# Patient Record
Sex: Male | Born: 1960 | Race: White | Hispanic: No | Marital: Married | Smoking: Former smoker
Health system: Southern US, Community
[De-identification: ages and names within clinical notes are randomized; demographics above are authoritative.]

## PROBLEM LIST (undated history)

## (undated) DIAGNOSIS — I10 Essential (primary) hypertension: Secondary | ICD-10-CM

## (undated) DIAGNOSIS — E119 Type 2 diabetes mellitus without complications: Secondary | ICD-10-CM

## (undated) DIAGNOSIS — K219 Gastro-esophageal reflux disease without esophagitis: Secondary | ICD-10-CM

## (undated) DIAGNOSIS — K760 Fatty (change of) liver, not elsewhere classified: Secondary | ICD-10-CM

## (undated) HISTORY — PX: REFRACTIVE SURGERY: SHX103

## (undated) HISTORY — PX: ROTATOR CUFF REPAIR: SHX139

## (undated) HISTORY — PX: COLONOSCOPY: SHX174

## (undated) HISTORY — PX: WISDOM TOOTH EXTRACTION: SHX21

## (undated) HISTORY — PX: VASECTOMY: SHX75

---

## 2009-12-12 ENCOUNTER — Ambulatory Visit: Payer: Self-pay | Admitting: Sports Medicine

## 2009-12-12 DIAGNOSIS — M216X9 Other acquired deformities of unspecified foot: Secondary | ICD-10-CM

## 2009-12-12 DIAGNOSIS — M25569 Pain in unspecified knee: Secondary | ICD-10-CM

## 2010-03-08 ENCOUNTER — Ambulatory Visit: Payer: Self-pay | Admitting: Sports Medicine

## 2010-11-20 ENCOUNTER — Ambulatory Visit: Admit: 2010-11-20 | Payer: Self-pay | Admitting: Sports Medicine

## 2010-11-26 NOTE — Assessment & Plan Note (Signed)
Summary: L KNEE PAIN,MC   Vital Signs:  Patient profile:   50 year old male Height:      70 inches Weight:      215 pounds BMI:     30.96 BP sitting:   142 / 95  Vitals Entered By: Lillia Pauls CMA (December 12, 2009 9:30 AM)  History of Present Illness: c/o infero-medial left knee pain of insidious onset in mid 09/2009. Preveiously ran twice daily. Pain preceeded by increase increase in daily lunch-time runs from 3 miles to 5 miles, along with basketball playing for 3 days. Previous avg weekly mileabge of 40 miles on the treadmill. Weekly avg down to 6-10 miles weekly (treadmill and elliptical) since onset of pain. Pain on prolonged ambulation. Minimal pain during runs. Decreased mileage as pre-emptive measure. Avg intensity 5/10. No radiation. No knee swelling/locking/buckling/catching. Occasionally painless popping. No prior left knee injuries or surgeries. No back/hip/ankle/foot pain.  Allergies (verified): No Known Drug Allergies  Past History:  Past Medical History: HTN - on combined lisinopril/hctz HLD - on lipitor  Obesity - has lost from 260 lbs all the way to 205 since started running program  Social History: Occasional ETOH No T/D  Solicitor of a [employer]  Physical Exam  General:  Well-developed,well-nourished,in no acute distress; alert,appropriate and cooperative throughout examination Msk:  HIPS: FROM. 5/5 strength throughout ROM testing.  KNEES: Slight valgus of left knee. No discoloration. No swelling or increased warmth. FROM. 5/5 strength.  Minimal dyscomfort to palpation over mid-medial joint line on the left. (-) left McMurray's/Thesally's. Normal ligament laxity. Equiovcal re: reproduced dyscomfort on valgus stress testing. (-) crepitus. Normal patella mobility.  ANKLES/FEET: FROM. 5/5 strength. Mild longitudinal arch collapse on standing. Slight relative pronation on the left; controlled on insertion of comforthotics with left  scaphoid pad. Splayed 1st/2nd toes. No callouses.  * Good running form. Extremities:  Equal leg lengths. Neurologic:  Normal nv exam.   Impression & Recommendations:  Problem # 1:  KNEE PAIN, LEFT (ICD-719.46) Likely 2/2 cartilage bruise  - Gradually increase exericse frequency as tolerated, integrating more elliptical and recumbent biking exercise into routine. - Gradually transition toward integration of speed workouts into weekly routine. - Comforthotics with left scaphoid pad. - RTC in 6 wks if pain does not resolve.  Problem # 2:  OTHER ACQUIRED DEFORMITY OF ANKLE AND FOOT OTHER (ICD-736.79)  - Comforthotics with medium scaphoid pad applied to the left insert. - RTC in 6 wks or sooner as needed any concerns.  Orders: [facility] [employer] [phone])

## 2010-11-26 NOTE — Assessment & Plan Note (Signed)
Summary: f/u,mc   Vital Signs:  Patient profile:   50 year old male BP sitting:   133 / 88  Vitals Entered By: Lillia Pauls CMA (Mar 08, 2010 11:30 AM)  History of Present Illness: Reports to f/u left knee pain. Following exercise regimen as previously instructed. Minimal improvement wrt to his left knee pain. Runs twice weekly, 3 miles each run. No pain during runs. Pain occurs the day after each run. Unknown moderating factors. Pain level reaches 7/10. Notes increased time (total 2-3 days) needed for recovery between runs which has not changed. No back pain or LE paresthesias. No knee swelling, locking, or catching. Still some painless popping. No prior left knee injuries or procedures.  Allergies: No Known Drug Allergies PMH-FH-SH reviewed for relevance  Physical Exam  General:  Well-developed,well-nourished,in no acute distress; alert,appropriate and cooperative throughout examination Msk:  LEFT KNEE: Unchanged from last examination except for equivocal thesally's. Still no ttp over pes bursa.  RUNNING FORM: Out-toeing of the feet (R>L) with dynamic pronation.  Additional Exam:  Musculoskeletal Ultrasound: Longitudinal and transverse views of the medial left knee revealed the following:  Menisci: Normal appearnce of the medial meniscus. Quadriceps Tendon: Normal. Patella: Normal. Patella Tendon: Normal. Tibial Tuberosity: Normal.     Impression & Recommendations:  Problem # 1:  KNEE PAIN, LEFT (ICD-719.46) Possibly underlying meniscal tear +/- chondramalacia. All management options, including their respective risks and benefits, were discussed with the patient during this encounter.   After obtaining informed verbal consent from the patient, a 27ml:2ml mixture of lidocaine and kenalog 40mg /ml was injected into the infero-medial aspect of his anterior left knee which was prepped with alcohol and betadine and anesthetized with ethyl chloride beforehand. No  procedural complications. Patient tolerated this procedure well.  Will order knee x-rays to r/o bony abnl and to evaluate joint spaces.  Immediately seek MD attention for fever, knee redness/swelling, increased knee pain or any other concerns.  Otherwise RTC in 1-2 wks. Will re-consider left knee MRI to r/o cartilage defect if no signfiicant improvement.   Orders: Radiology other (Radiology Other) [place] LIMITED [phone]) Joint Aspirate / Injection, Large (20610) Kenalog 10 mg inj (J3301)  Problem # 2:  OTHER ACQUIRED DEFORMITY OF ANKLE AND FOOT OTHER (ICD-736.79) Possibly contributing to chronic left knee pain.  - Continue use of comforthotics with scaphoid pads. - Start daily straight-line bounding drills, pidgeon-toe heel raises, and pigeon-toe walks, as demonstrated during this encounter. - Will consider custom orthotic construction given goals or working towards increasing weekly running mileage.

## 2017-01-14 ENCOUNTER — Ambulatory Visit (INDEPENDENT_AMBULATORY_CARE_PROVIDER_SITE_OTHER): Payer: 59 | Admitting: Orthopedic Surgery

## 2017-01-14 DIAGNOSIS — M25511 Pain in right shoulder: Secondary | ICD-10-CM | POA: Diagnosis not present

## 2017-01-14 NOTE — Progress Notes (Signed)
Office Visit Note   Patient: Clinton Weaver           Date of Birth: 1961-07-27           MRN: [id number] Visit Date: 01/14/2017 Requested by: No referring provider defined for this encounter. PCP: No PCP Per Patient  Subjective: No chief complaint on file.   HPI: Noam is a 56 year old patient with right shoulder pain.  He had what sounds like subacromial decompression and partial thickness rotator cuff tear debridement 20 years ago.  Arthroscopic incisional scars are present.  Arthroscopic rotator cuff repair as not being done at that time.  He did well until last summer when he developed pain in the shoulder throwing baseball on the beach.  He did not have a discrete injury but he states that the next day he couldn't lift his arm and he isn't having symptoms since that time.  3 months later he had a cortisone injection which helped but then his symptoms recurred.  He describes pain raising his arm up as well as pain which wakes him from sleep on nightly basis.  He states she really can't lift anything.  He does not take much in way of medication for the problem on a consistent basis.  He denies any neck pain or radicular symptoms.  Localizes the pain to the anterior and lateral aspect of the right shoulder.              ROS: All systems reviewed are negative as they relate to the chief complaint within the history of present illness.  Patient denies  fevers or chills.   Assessment & Plan: Visit Diagnoses:  1. Acute pain of right shoulder     Plan: Impression is right shoulder pain which may have a component of bursitis as well as a small recurrent rotator cuff tear versus intra-articular labral and biceps tendon pathology.  He is not tender to the acromioclavicular joint to direct palpation.  Plan at this time his MRI scan right shoulder.  It was very difficult recovery for for him 20 years ago and thus he wants to avoid surgery at all costs.  I'll see him back after his scan. Subacromial  injection is performed today for pain relief. Follow-Up Instructions: Return for after MRI.   Orders:  Orders Placed This Encounter  Procedures  . Arthrogram   No orders of the defined types were placed in this encounter.     Procedures: Large Joint Inj Date/Time: 01/15/2017 4:07 AM Performed by: Cammy Copa Authorized by: Cammy Copa   Consent Given by:  Patient Site marked: the procedure site was marked   Timeout: prior to procedure the correct patient, procedure, and site was verified   Indications:  Pain and diagnostic evaluation Location:  Shoulder Site:  R subacromial bursa Prep: patient was prepped and draped in usual sterile fashion   Needle Size:  18 G Needle Length:  1.5 inches Approach:  Posterior Ultrasound Guidance: No   Fluoroscopic Guidance: No   Arthrogram: No   Medications:  5 mL lidocaine 1 %; 9 mL bupivacaine 0.5 %; 40 mg methylPREDNISolone acetate 40 MG/ML Aspiration Attempted: No   Patient tolerance:  Patient tolerated the procedure well with no immediate complications     Clinical Data: No additional findings.  Objective: Vital Signs: There were no vitals taken for this visit.  Physical Exam:   Constitutional: Patient appears well-developed HEENT:  Head: Normocephalic Eyes:EOM are normal Neck: Normal range of motion Cardiovascular:  Normal rate Pulmonary/chest: Effort normal Neurologic: Patient is alert Skin: Skin is warm Psychiatric: Patient has normal mood and affect    Ortho Exam: Orthopedic exam demonstrates good cervical spine range of motion.  Intact motor sensory function to the right arm.  He does have pain with overhead motion on the right compared to the left but no restriction of passive external rotation right versus left.  Rotator cuff strength is painful to infraspinatus testing on the right but not the left and he does have a little weakness there.  O'Brien's testing positive on the right negative on the  left.  There is no discrete tenderness to the right or left acromioclavicular joint.  Negative apprehension relocation testing on the right-hand side.  No other masses lymph adenopathy or skin changes noted in the right shoulder region well-healed surgical incisions are present  Specialty Comments:  No specialty comments available.  Imaging: No results found.   PMFS History: Patient Active Problem List   Diagnosis Date Noted  . KNEE PAIN, LEFT 12/12/2009  . OTHER ACQUIRED DEFORMITY OF ANKLE AND FOOT OTHER 12/12/2009   No past medical history on file.  No family history on file.  No past surgical history on file. Social History   Occupational History  . Not on file.   Social History Main Topics  . Smoking status: Not on file  . Smokeless tobacco: Not on file  . Alcohol use Not on file  . Drug use: Unknown  . Sexual activity: Not on file

## 2017-01-15 DIAGNOSIS — M25511 Pain in right shoulder: Secondary | ICD-10-CM | POA: Diagnosis not present

## 2017-01-15 MED ORDER — BUPIVACAINE HCL 0.5 % IJ SOLN
9.0000 mL | INTRAMUSCULAR | Status: AC | PRN
  Administered 2017-01-15: 9 mL via INTRA_ARTICULAR

## 2017-01-15 MED ORDER — LIDOCAINE HCL 1 % IJ SOLN
5.0000 mL | INTRAMUSCULAR | Status: AC | PRN
Start: 2017-01-15 — End: 2017-01-15
  Administered 2017-01-15: 5 mL

## 2017-01-15 MED ORDER — METHYLPREDNISOLONE ACETATE 40 MG/ML IJ SUSP
40.0000 mg | INTRAMUSCULAR | Status: AC | PRN
  Administered 2017-01-15: 40 mg via INTRA_ARTICULAR

## 2017-02-04 ENCOUNTER — Ambulatory Visit
Admission: RE | Admit: 2017-02-04 | Discharge: 2017-02-04 | Disposition: A | Discharge status: Home / Self Care | Payer: 59 | Source: Ambulatory Visit | Attending: Orthopedic Surgery | Admitting: Orthopedic Surgery

## 2017-02-04 DIAGNOSIS — M25511 Pain in right shoulder: Secondary | ICD-10-CM

## 2017-02-04 MED ORDER — IOPAMIDOL (ISOVUE-M 200) INJECTION 41%: 15.0000 mL | Freq: Once | INTRAMUSCULAR | Status: DC

## 2017-02-11 ENCOUNTER — Encounter (INDEPENDENT_AMBULATORY_CARE_PROVIDER_SITE_OTHER): Payer: Self-pay | Admitting: Orthopedic Surgery

## 2017-02-11 ENCOUNTER — Ambulatory Visit (INDEPENDENT_AMBULATORY_CARE_PROVIDER_SITE_OTHER): Payer: 59 | Admitting: Orthopedic Surgery

## 2017-02-11 DIAGNOSIS — M25511 Pain in right shoulder: Secondary | ICD-10-CM | POA: Diagnosis not present

## 2017-02-12 ENCOUNTER — Encounter (INDEPENDENT_AMBULATORY_CARE_PROVIDER_SITE_OTHER): Payer: Self-pay | Admitting: Orthopedic Surgery

## 2017-02-12 NOTE — Progress Notes (Signed)
Office Visit Note   Patient: Clinton Weaver           Date of Birth: 1961/03/08           MRN: [id number] Visit Date: 02/11/2017 Requested by: No referring provider defined for this encounter. PCP: No PCP Per Patient  Subjective: Chief Complaint  Patient presents with  . Right Shoulder - Follow-up    HPI: Clinton Weaver is a 56 year old patient here to review MRI scan.  He had an MRI scan of his right shoulder performed a week ago.  Has the same symptoms in the shoulder with weakness and pain.  That is essentially unchanged.  I reviewed the MRI scan with him and he has about a 90% thickness tear of the supraspinatus.  There is no dye extravasation into the subacromial space but the footprint is almost completely exposed superiorly.  He also has some tendinopathy of the biceps tendon which is reviewed.              ROS: All systems reviewed are negative as they relate to the chief complaint within the history of present illness.  Patient denies  fevers or chills.   Assessment & Plan: Visit Diagnoses:  1. Right shoulder pain, unspecified chronicity     Plan: Impression is right shoulder rotator cuff tear with functional limitations of strength and fairly significant pain with any type of activity of daily living.  He can't really do anything athletically.  His symptoms are worsening.  He does have weakness on exam as well.  He is a little concerned about surgery with all his medical problems and thus he will see his medical doctor prior to considering any surgery.  I do think that this particular tear with the progression of his symptoms over time does not have the potential to heal on its own.  On exam he doesn't have the course grinding and crepitus which I would expect with a full-thickness tear but I think he is essentially "hanging on by thread".  If he wants to schedule surgery we can get that done and I'll see him back as needed I did discuss with him the rehabilitation and expected healing  course to expect. Follow-Up Instructions: Return if symptoms worsen or fail to improve.   Orders:  No orders of the defined types were placed in this encounter.  No orders of the defined types were placed in this encounter.     Procedures: No procedures performed   Clinical Data: No additional findings.  Objective: Vital Signs: There were no vitals taken for this visit.  Physical Exam:   Constitutional: Patient appears well-developed HEENT:  Head: Normocephalic Eyes:EOM are normal Neck: Normal range of motion Cardiovascular: Normal rate Pulmonary/chest: Effort normal Neurologic: Patient is alert Skin: Skin is warm Psychiatric: Patient has normal mood and affect    Ortho Exam: Orthopedic exam demonstrates good cervical spine range of motion some weakness to supraspinatus testing and infraspinatus testing the subscap strength is intact.  O'Brien's testing and speed's testing positive on the right negative on the left.  Impingement signs equivocal on the right negative on the left.  No restriction of passive motion on the right compared to the left.  No course grinding or crepitus with internal/external rotation of that right hand side  Specialty Comments:  No specialty comments available.  Imaging: No results found.   PMFS History: Patient Active Problem List   Diagnosis Date Noted  . KNEE PAIN, LEFT 12/12/2009  . OTHER ACQUIRED DEFORMITY  OF ANKLE AND FOOT OTHER 12/12/2009   No past medical history on file.  No family history on file.  No past surgical history on file. Social History   Occupational History  . Not on file.   Social History Main Topics  . Smoking status: Not on file  . Smokeless tobacco: Not on file  . Alcohol use Not on file  . Drug use: Unknown  . Sexual activity: Not on file

## 2017-06-24 ENCOUNTER — Encounter (INDEPENDENT_AMBULATORY_CARE_PROVIDER_SITE_OTHER): Payer: Self-pay | Admitting: Orthopedic Surgery

## 2017-06-24 ENCOUNTER — Ambulatory Visit (INDEPENDENT_AMBULATORY_CARE_PROVIDER_SITE_OTHER): Payer: 59 | Admitting: Orthopedic Surgery

## 2017-06-24 DIAGNOSIS — M75101 Unspecified rotator cuff tear or rupture of right shoulder, not specified as traumatic: Secondary | ICD-10-CM | POA: Diagnosis not present

## 2017-06-25 NOTE — Progress Notes (Signed)
Office Visit Note   Patient: Clinton Weaver           Date of Birth: 11/02/1960           MRN: [id number] Visit Date: 06/24/2017 Requested by: No referring provider defined for this encounter. PCP: Jolene ProvostHaimes, David M, MD  Subjective: Chief Complaint  Patient presents with  . Right Shoulder - Pain    HPI: Clinton Weaver is a [age] year old patient with right shoulder pain.  Since I have seen him he has had an MRI scan which is reviewed.  He has essentially a full-thickness tear of the supraspinatus.  Subscapularis and infraspinatus intact.  No fatty atrophy of the muscles.  There is some tendinosis of the biceps tendon.  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  He is functionally limited with his shoulder in terms of throwing and doing physical activities.  He has had conservative care and that has not been sufficient in terms of restoring his pain-free function              ROS: All systems reviewed are negative as they relate to the chief complaint within the history of present illness.  Patient denies  fevers or chills.   Assessment & Plan: Visit Diagnoses: No diagnosis found.  Plan: Impression is right shoulder rotator cuff tear with biceps tendon pathology.  Not much in the way of tenderness to the acromioclavicular joint.  Plan is arthroscopy, evaluation of the biceps tendon, any open rotator cuff tear repair.  Risks and benefits are discussed with the patient could not limited to infection or vessel damage shoulder stiffness as well as an ability to return to previous level of physical activity.  I do plan to use CPM machine postoperatively.  All questions answered.  Rehabilitation course discussed.  Follow-Up Instructions: No Follow-up on file.   Orders:  No orders of the defined types were placed in this encounter.  No orders of the defined types were placed in this encounter.     Procedures: No procedures performed   Clinical  Data: No additional findings.  Objective: Vital Signs: There were no vitals taken for this visit.  Physical Exam:   Constitutional: Patient appears well-developed HEENT:  Head: Normocephalic Eyes:EOM are normal Neck: Normal range of motion Cardiovascular: Normal rate Pulmonary/chest: Effort normal Neurologic: Patient is alert Skin: Skin is warm Psychiatric: Patient has normal mood and affect    Ortho Exam: Orthopedic exam demonstrates good cervical spine range of motion.  His right shoulder has good range of motion today with no restriction of external rotation at 15 abduction.  Motor sensory function to the hand is intact.  He does have positive O'Brien's testing negative speed's testing negative apprehension testing no acromioclavicular joint tenderness to direct palpation right and left.  A little weakness to resisted abduction on the right compared to the left.  Specialty Comments:  No specialty comments available.  Imaging: No results found.   PMFS History: Patient Active Problem List   Diagnosis Date Noted  . KNEE PAIN, LEFT 12/12/2009  . OTHER ACQUIRED DEFORMITY OF ANKLE AND FOOT OTHER 12/12/2009   No past medical history on file.  No family history on file.  No past surgical history on file. Social History   Occupational History  . Not on file.   Social History Main Topics  . Smoking status: Unknown If Ever Smoked  . Smokeless tobacco: Never Used  . Alcohol use  Not on file  . Drug use: Unknown  . Sexual activity: Not on file

## 2017-09-09 ENCOUNTER — Other Ambulatory Visit (INDEPENDENT_AMBULATORY_CARE_PROVIDER_SITE_OTHER): Payer: Self-pay | Admitting: Orthopedic Surgery

## 2017-09-09 DIAGNOSIS — M75101 Unspecified rotator cuff tear or rupture of right shoulder, not specified as traumatic: Secondary | ICD-10-CM

## 2017-09-23 ENCOUNTER — Other Ambulatory Visit: Payer: Self-pay

## 2017-09-23 ENCOUNTER — Encounter (HOSPITAL_COMMUNITY): Payer: Self-pay

## 2017-09-23 ENCOUNTER — Other Ambulatory Visit (HOSPITAL_COMMUNITY): Payer: Self-pay | Admitting: *Deleted

## 2017-09-23 ENCOUNTER — Encounter (HOSPITAL_COMMUNITY)
Admission: RE | Admit: 2017-09-23 | Discharge: 2017-09-23 | Disposition: A | Discharge status: Home / Self Care | Payer: 59 | Source: Ambulatory Visit | Attending: Orthopedic Surgery | Admitting: Orthopedic Surgery

## 2017-09-23 DIAGNOSIS — F419 Anxiety disorder, unspecified: Secondary | ICD-10-CM | POA: Diagnosis not present

## 2017-09-23 DIAGNOSIS — I10 Essential (primary) hypertension: Secondary | ICD-10-CM | POA: Diagnosis not present

## 2017-09-23 DIAGNOSIS — Z79899 Other long term (current) drug therapy: Secondary | ICD-10-CM | POA: Diagnosis not present

## 2017-09-23 DIAGNOSIS — K219 Gastro-esophageal reflux disease without esophagitis: Secondary | ICD-10-CM | POA: Diagnosis not present

## 2017-09-23 DIAGNOSIS — E119 Type 2 diabetes mellitus without complications: Secondary | ICD-10-CM | POA: Diagnosis not present

## 2017-09-23 DIAGNOSIS — K760 Fatty (change of) liver, not elsewhere classified: Secondary | ICD-10-CM | POA: Diagnosis not present

## 2017-09-23 DIAGNOSIS — M75111 Incomplete rotator cuff tear or rupture of right shoulder, not specified as traumatic: Secondary | ICD-10-CM | POA: Diagnosis not present

## 2017-09-23 DIAGNOSIS — M7521 Bicipital tendinitis, right shoulder: Secondary | ICD-10-CM | POA: Diagnosis not present

## 2017-09-23 DIAGNOSIS — Z6832 Body mass index (BMI) 32.0-32.9, adult: Secondary | ICD-10-CM | POA: Diagnosis not present

## 2017-09-23 DIAGNOSIS — Z7982 Long term (current) use of aspirin: Secondary | ICD-10-CM | POA: Diagnosis not present

## 2017-09-23 DIAGNOSIS — Z87891 Personal history of nicotine dependence: Secondary | ICD-10-CM | POA: Diagnosis not present

## 2017-09-23 DIAGNOSIS — Z7984 Long term (current) use of oral hypoglycemic drugs: Secondary | ICD-10-CM | POA: Diagnosis not present

## 2017-09-23 DIAGNOSIS — Z8249 Family history of ischemic heart disease and other diseases of the circulatory system: Secondary | ICD-10-CM | POA: Diagnosis not present

## 2017-09-23 HISTORY — DX: Type 2 diabetes mellitus without complications: E11.9

## 2017-09-23 HISTORY — DX: Gastro-esophageal reflux disease without esophagitis: K21.9

## 2017-09-23 HISTORY — DX: Fatty (change of) liver, not elsewhere classified: K76.0

## 2017-09-23 HISTORY — DX: Essential (primary) hypertension: I10

## 2017-09-23 LAB — COMPREHENSIVE METABOLIC PANEL
ALT: 132 U/L — AB (ref 17–63)
AST: 121 U/L — ABNORMAL HIGH (ref 15–41)
Albumin: 4.7 g/dL (ref 3.5–5.0)
Alkaline Phosphatase: 220 U/L — ABNORMAL HIGH (ref 38–126)
Anion gap: 13 (ref 5–15)
BILIRUBIN TOTAL: 1.1 mg/dL (ref 0.3–1.2)
BUN: 16 mg/dL (ref 6–20)
CHLORIDE: 98 mmol/L — AB (ref 101–111)
CO2: 22 mmol/L (ref 22–32)
CREATININE: 0.93 mg/dL (ref 0.61–1.24)
Calcium: 10.1 mg/dL (ref 8.9–10.3)
Glucose, Bld: 130 mg/dL — ABNORMAL HIGH (ref 65–99)
Potassium: 4.1 mmol/L (ref 3.5–5.1)
Sodium: 133 mmol/L — ABNORMAL LOW (ref 135–145)
TOTAL PROTEIN: 8.2 g/dL — AB (ref 6.5–8.1)

## 2017-09-23 LAB — GLUCOSE, CAPILLARY: GLUCOSE-CAPILLARY: 134 mg/dL — AB (ref 65–99)

## 2017-09-23 LAB — CBC
HCT: 49.6 % (ref 39.0–52.0)
Hemoglobin: 16.6 g/dL (ref 13.0–17.0)
MCH: 31.3 pg (ref 26.0–34.0)
MCHC: 33.5 g/dL (ref 30.0–36.0)
MCV: 93.4 fL (ref 78.0–100.0)
Platelets: 222 10*3/uL (ref 150–400)
RBC: 5.31 MIL/uL (ref 4.22–5.81)
RDW: 13 % (ref 11.5–15.5)
WBC: 8.9 10*3/uL (ref 4.0–10.5)

## 2017-09-23 LAB — HEMOGLOBIN A1C
HEMOGLOBIN A1C: 7.9 % — AB (ref 4.8–5.6)
MEAN PLASMA GLUCOSE: 180.03 mg/dL

## 2017-09-23 NOTE — Pre-Procedure Instructions (Signed)
Clinton CommanderJohn Weaver  09/23/2017    Your procedure is scheduled on Friday, September 25, 2017 at 7:30 AM.   [facility] at 5:30 AM.   Call this number if you have problems the morning of surgery: [phone]   Questions prior to day of surgery, please call [phone] between 8 & 4 PM.   Remember:  Do not eat food or drink liquids after midnight Thursday, 09/24/17.  Take these medicines the morning of surgery with A SIP OF WATER: Diltiazem (Cardizem), Venlafaxine (Effexor), Ranitidine (Zantac) - if needed.  Do not take your Sitagliptin-Metformin (Janumet) or Canaglifozin (Invokana) morning of surgery.  Do not use Aspirin products or NSAIDS (Ibuprofen, Aleve, etc) prior to surgery.   How to Manage Your Diabetes Before Surgery   Why is it important to control my blood sugar before and after surgery?   Improving blood sugar levels before and after surgery helps healing and can limit problems.  A way of improving blood sugar control is eating a healthy diet by:  - Eating less sugar and carbohydrates  - Increasing activity/exercise  - Talk with your doctor about reaching your blood sugar goals  High blood sugars (greater than 180 mg/dL) can raise your risk of infections and slow down your recovery so you will need to focus on controlling your diabetes during the weeks before surgery.  Make sure that the doctor who takes care of your diabetes knows about your planned surgery including the date and location.  How do I manage my blood sugars before surgery?   Check your blood sugar at least 4 times a day, 2 days before surgery to make sure that they are not too high or low.  Check your blood sugar the morning of your surgery when you wake up and every 2 hours until you get to the Short-Stay unit.  Treat a low blood sugar (less than 70 mg/dL) with 1/2 cup of clear juice (cranberry or apple), 4 glucose tablets, OR glucose gel.  Recheck  blood sugar in 15 minutes after treatment (to make sure it is greater than 70 mg/dL).  If blood sugar is not greater than 70 mg/dL on re-check, call [phone] for further instructions.   Report your blood sugar to the Short-Stay nurse when you get to Short-Stay.  References:  [facility], 2007 "How to Manage your Diabetes Before and After Surgery".   Do not wear jewelry.  Do not wear lotions, powders, cologne or deodorant.  Men may shave face and neck.  Do not bring valuables to the hospital.  [facility] is not responsible for any belongings or valuables.  Contacts, dentures or bridgework may not be worn into surgery.  Leave your suitcase in the car.  After surgery it may be brought to your room.  For patients admitted to the hospital, discharge time will be determined by your treatment team.  Patients discharged the day of surgery will not be allowed to drive home.   [facility] - Preparing for Surgery  Before surgery, you can play an important role.  Because skin is not sterile, your skin needs to be as free of germs as possible.  You can reduce the number of germs on you skin by washing with CHG (chlorahexidine gluconate) soap before surgery.  CHG is an antiseptic cleaner which kills germs and bonds with the skin to continue killing germs even after washing.  Please DO NOT use if you have an  allergy to CHG or antibacterial soaps.  If your skin becomes reddened/irritated stop using the CHG and inform your nurse when you arrive at Short Stay.  Do not shave (including legs and underarms) for at least 48 hours prior to the first CHG shower.  You may shave your face.  Please follow these instructions carefully:   1.  Shower with CHG Soap the night before surgery and the                    morning of Surgery.  2.  If you choose to wash your hair, wash your hair first as usual with your       normal shampoo.  3.  After you shampoo, rinse your hair and body  thoroughly to remove the shampoo.  4.  Use CHG as you would any other liquid soap.  You can apply chg directly       to the skin and wash gently with scrungie or a clean washcloth.  5.  Apply the CHG Soap to your body ONLY FROM THE NECK DOWN.        Do not use on open wounds or open sores.  Avoid contact with your eyes, ears, mouth and genitals (private parts).  Wash genitals (private parts) with your normal soap.  6.  Wash thoroughly, paying special attention to the area where your surgery        will be performed.  7.  Thoroughly rinse your body with warm water from the neck down.  8.  DO NOT shower/wash with your normal soap after using and rinsing off       the CHG Soap.  9.  Pat yourself dry with a clean towel.            10.  Wear clean pajamas.            11.  Place clean sheets on your bed the night of your first shower and do not        sleep with pets.  Day of Surgery  Shower as above. Do not apply any lotions/deodorants the morning of surgery.  Please wear clean clothes to the hospital.   Please read over the fact sheets that you were given.

## 2017-09-23 NOTE — Progress Notes (Signed)
Pt denies cardiac history, chest pain or sob. Pt is a type 2 diabetic. Thinks his last A1C was 8.something 3 months ago. Pt wears a blood sugar sensor 24/7. His fasting blood sugar averages between 150-160.

## 2017-09-24 NOTE — Progress Notes (Signed)
Anesthesia Chart Review:  Pt is a 56 year old male scheduled for R shoulder arthroplasty with mini-open rotator cuff repair and subacromial decompression on 09/25/2017 with Meredith Pel, M.D.  - PCP is Charlcie Cradle, MD (notes in care everywhere)   PMH includes:  HTN, DM, fatty liver. Former smoker. BMI 33  Medications include: ASA 81 mg, Lipitor, invokana, diltiazem, Zetia, losartan-HCTZ, Zantac, sitagliptin-metformin  VS BP (!) 144/86   Pulse 87   Temp 36.9 C   Resp 20   Ht 5' 10"  (1.778 m)   Wt 227 lb 12.8 oz (103.3 kg)   SpO2 96%   BMI 32.69 kg/m   Preoperative labs reviewed.   - HbA1c 7.9, glucose 130  - AST 121, ALT 132, alk phos 220. These results are higher but still consistent with labs from PCP's office 09/07/17 (see care everywhere: AST 90, ALT 116, alk phos 207); pt has known fatty liver.   - Will get PT/PTT day of surgery  EKG 09/23/17: NSR. Nonspecific T wave abnormality  Abdominal US 03/31/17 (care everywhere):  - Increased hepatic echogenicity consistent with fatty infiltration and/or hepatocellular disease.  - No focal hepatic abnormality identified.  - No gallstones or biliary distention.  If labs acceptable day of surgery, I anticipate pt can proceed with surgery as scheduled.   Willeen Cass, FNP-BC [facility]/Anesthesiology Phone: [phone] 09/24/2017 10:24 AM

## 2017-09-24 NOTE — Anesthesia Preprocedure Evaluation (Addendum)
Anesthesia Evaluation  Patient identified by MRN, date of birth, ID band Patient awake    Reviewed: Allergy & Precautions, NPO status , Patient's Chart, lab work & pertinent test results  History of Anesthesia Complications Negative for: history of anesthetic complications  Airway Mallampati: II  TM Distance: >3 FB Neck ROM: Full    Dental  (+) Dental Advisory Given, Teeth Intact   Pulmonary neg pulmonary ROS, former smoker,    breath sounds clear to auscultation       Cardiovascular hypertension, Pt. on medications (-) angina Rhythm:Regular Rate:Normal     Neuro/Psych Anxiety negative neurological ROS     GI/Hepatic Neg liver ROS, GERD  Medicated and Controlled,Elevated LFTs: 6/18 US Increase echogenicity consistent fatty infiltration and/or hepatocellular disease   Endo/Other  diabetes (glu 167), Oral Hypoglycemic AgentsMorbid obesity  Renal/GU negative Renal ROS     Musculoskeletal   Abdominal (+) + obese,   Peds  Hematology   Anesthesia Other Findings   Reproductive/Obstetrics                           Anesthesia Physical Anesthesia Plan  ASA: II  Anesthesia Plan: General   Post-op Pain Management: GA combined w/ Regional for post-op pain   Induction: Intravenous  PONV Risk Score and Plan: 2 and Ondansetron, Dexamethasone and Midazolam  Airway Management Planned: Oral ETT  Additional Equipment:   Intra-op Plan:   Post-operative Plan: Extubation in OR  Informed Consent: I have reviewed the patients History and Physical, chart, labs and discussed the procedure including the risks, benefits and alternatives for the proposed anesthesia with the patient or authorized representative who has indicated his/her understanding and acceptance.   Dental advisory given  Plan Discussed with: CRNA, Surgeon and Anesthesiologist  Anesthesia Plan Comments: (Plan routine monitors, GETA  with interscalene block for post op analgesia)       Anesthesia Quick Evaluation

## 2017-09-25 ENCOUNTER — Ambulatory Visit (HOSPITAL_COMMUNITY): Payer: 59 | Admitting: Emergency Medicine

## 2017-09-25 ENCOUNTER — Ambulatory Visit (HOSPITAL_COMMUNITY): Payer: 59 | Admitting: Certified Registered Nurse Anesthetist

## 2017-09-25 ENCOUNTER — Ambulatory Visit (HOSPITAL_COMMUNITY)
Admission: RE | Admit: 2017-09-25 | Discharge: 2017-09-25 | Disposition: A | Discharge status: Home / Self Care | Payer: 59 | Source: Ambulatory Visit | Attending: Orthopedic Surgery | Admitting: Orthopedic Surgery

## 2017-09-25 ENCOUNTER — Encounter (HOSPITAL_COMMUNITY): Payer: Self-pay | Admitting: Certified Registered Nurse Anesthetist

## 2017-09-25 ENCOUNTER — Encounter (HOSPITAL_COMMUNITY)
Admission: RE | Disposition: A | Discharge status: Home / Self Care | Payer: Self-pay | Source: Ambulatory Visit | Attending: Orthopedic Surgery

## 2017-09-25 DIAGNOSIS — E119 Type 2 diabetes mellitus without complications: Secondary | ICD-10-CM | POA: Insufficient documentation

## 2017-09-25 DIAGNOSIS — I10 Essential (primary) hypertension: Secondary | ICD-10-CM | POA: Insufficient documentation

## 2017-09-25 DIAGNOSIS — Z7984 Long term (current) use of oral hypoglycemic drugs: Secondary | ICD-10-CM | POA: Insufficient documentation

## 2017-09-25 DIAGNOSIS — Z87891 Personal history of nicotine dependence: Secondary | ICD-10-CM | POA: Insufficient documentation

## 2017-09-25 DIAGNOSIS — K760 Fatty (change of) liver, not elsewhere classified: Secondary | ICD-10-CM | POA: Insufficient documentation

## 2017-09-25 DIAGNOSIS — M75111 Incomplete rotator cuff tear or rupture of right shoulder, not specified as traumatic: Secondary | ICD-10-CM | POA: Diagnosis not present

## 2017-09-25 DIAGNOSIS — M75101 Unspecified rotator cuff tear or rupture of right shoulder, not specified as traumatic: Secondary | ICD-10-CM

## 2017-09-25 DIAGNOSIS — Z8249 Family history of ischemic heart disease and other diseases of the circulatory system: Secondary | ICD-10-CM | POA: Insufficient documentation

## 2017-09-25 DIAGNOSIS — Z6832 Body mass index (BMI) 32.0-32.9, adult: Secondary | ICD-10-CM | POA: Insufficient documentation

## 2017-09-25 DIAGNOSIS — M7521 Bicipital tendinitis, right shoulder: Secondary | ICD-10-CM | POA: Insufficient documentation

## 2017-09-25 DIAGNOSIS — Z7982 Long term (current) use of aspirin: Secondary | ICD-10-CM | POA: Insufficient documentation

## 2017-09-25 DIAGNOSIS — K219 Gastro-esophageal reflux disease without esophagitis: Secondary | ICD-10-CM | POA: Insufficient documentation

## 2017-09-25 DIAGNOSIS — Z79899 Other long term (current) drug therapy: Secondary | ICD-10-CM | POA: Insufficient documentation

## 2017-09-25 DIAGNOSIS — F419 Anxiety disorder, unspecified: Secondary | ICD-10-CM | POA: Insufficient documentation

## 2017-09-25 DIAGNOSIS — M75121 Complete rotator cuff tear or rupture of right shoulder, not specified as traumatic: Secondary | ICD-10-CM | POA: Diagnosis not present

## 2017-09-25 HISTORY — PX: SHOULDER ARTHROSCOPY WITH ROTATOR CUFF REPAIR AND SUBACROMIAL DECOMPRESSION: SHX5686

## 2017-09-25 LAB — GLUCOSE, CAPILLARY
GLUCOSE-CAPILLARY: 167 mg/dL — AB (ref 65–99)
Glucose-Capillary: 191 mg/dL — ABNORMAL HIGH (ref 65–99)

## 2017-09-25 LAB — PROTIME-INR
INR: 0.99
Prothrombin Time: 13 seconds (ref 11.4–15.2)

## 2017-09-25 LAB — APTT: aPTT: 28 seconds (ref 24–36)

## 2017-09-25 SURGERY — SHOULDER ARTHROSCOPY WITH ROTATOR CUFF REPAIR AND SUBACROMIAL DECOMPRESSION
Anesthesia: Regional | Site: Shoulder | Laterality: Right

## 2017-09-25 MED ORDER — PHENYLEPHRINE HCL 10 MG/ML IJ SOLN
INTRAMUSCULAR | Status: DC | PRN
  Administered 2017-09-25: 40 ug/min via INTRAVENOUS

## 2017-09-25 MED ORDER — MIDAZOLAM HCL 2 MG/2ML IJ SOLN
INTRAMUSCULAR | Status: AC
  Filled 2017-09-25: qty 2

## 2017-09-25 MED ORDER — MEPERIDINE HCL 25 MG/ML IJ SOLN: 6.2500 mg | INTRAMUSCULAR | Status: DC | PRN

## 2017-09-25 MED ORDER — EPINEPHRINE PF 1 MG/ML IJ SOLN
INTRAMUSCULAR | Status: AC
  Filled 2017-09-25: qty 1

## 2017-09-25 MED ORDER — PROPOFOL 10 MG/ML IV BOLUS
INTRAVENOUS | Status: DC | PRN
  Administered 2017-09-25: 200 mg via INTRAVENOUS

## 2017-09-25 MED ORDER — HYDROMORPHONE HCL 1 MG/ML IJ SOLN
INTRAMUSCULAR | Status: AC
  Filled 2017-09-25: qty 1

## 2017-09-25 MED ORDER — HYDROMORPHONE HCL 1 MG/ML IJ SOLN
0.2500 mg | INTRAMUSCULAR | Status: DC | PRN
  Administered 2017-09-25: 0.5 mg via INTRAVENOUS

## 2017-09-25 MED ORDER — SUCCINYLCHOLINE CHLORIDE 200 MG/10ML IV SOSY
PREFILLED_SYRINGE | INTRAVENOUS | Status: AC
Start: 2017-09-25 — End: ?
  Filled 2017-09-25: qty 10

## 2017-09-25 MED ORDER — EPINEPHRINE PF 1 MG/ML IJ SOLN
INTRAMUSCULAR | Status: AC
  Filled 2017-09-25: qty 2

## 2017-09-25 MED ORDER — PROMETHAZINE HCL 25 MG/ML IJ SOLN: 6.2500 mg | INTRAMUSCULAR | Status: DC | PRN

## 2017-09-25 MED ORDER — BUPIVACAINE-EPINEPHRINE (PF) 0.5% -1:200000 IJ SOLN
INTRAMUSCULAR | Status: DC | PRN
  Administered 2017-09-25: 30 mL via PERINEURAL

## 2017-09-25 MED ORDER — FENTANYL CITRATE (PF) 250 MCG/5ML IJ SOLN
INTRAMUSCULAR | Status: AC
  Filled 2017-09-25: qty 5

## 2017-09-25 MED ORDER — BUPIVACAINE HCL (PF) 0.25 % IJ SOLN
INTRAMUSCULAR | Status: DC | PRN
  Administered 2017-09-25: 30 mL

## 2017-09-25 MED ORDER — CEFAZOLIN SODIUM-DEXTROSE 2-4 GM/100ML-% IV SOLN
INTRAVENOUS | Status: AC
  Filled 2017-09-25: qty 100

## 2017-09-25 MED ORDER — SUGAMMADEX SODIUM 200 MG/2ML IV SOLN
INTRAVENOUS | Status: DC | PRN
  Administered 2017-09-25: 200 mg via INTRAVENOUS

## 2017-09-25 MED ORDER — ONDANSETRON HCL 4 MG/2ML IJ SOLN
INTRAMUSCULAR | Status: DC | PRN
  Administered 2017-09-25: 4 mg via INTRAVENOUS

## 2017-09-25 MED ORDER — MIDAZOLAM HCL 2 MG/2ML IJ SOLN: 0.5000 mg | Freq: Once | INTRAMUSCULAR | Status: DC | PRN

## 2017-09-25 MED ORDER — LIDOCAINE HCL (CARDIAC) 20 MG/ML IV SOLN
INTRAVENOUS | Status: DC | PRN
  Administered 2017-09-25: 30 mg via INTRAVENOUS

## 2017-09-25 MED ORDER — MIDAZOLAM HCL 5 MG/5ML IJ SOLN
INTRAMUSCULAR | Status: DC | PRN
  Administered 2017-09-25: 2 mg via INTRAVENOUS

## 2017-09-25 MED ORDER — DEXAMETHASONE SODIUM PHOSPHATE 10 MG/ML IJ SOLN
INTRAMUSCULAR | Status: AC
  Filled 2017-09-25: qty 1

## 2017-09-25 MED ORDER — PROPOFOL 10 MG/ML IV BOLUS
INTRAVENOUS | Status: AC
  Filled 2017-09-25: qty 20

## 2017-09-25 MED ORDER — CEFAZOLIN SODIUM-DEXTROSE 2-4 GM/100ML-% IV SOLN
2.0000 g | INTRAVENOUS | Status: AC
  Administered 2017-09-25: 2 g via INTRAVENOUS

## 2017-09-25 MED ORDER — SODIUM CHLORIDE 0.9 % IJ SOLN
INTRAMUSCULAR | Status: DC | PRN
  Administered 2017-09-25: 50 mL

## 2017-09-25 MED ORDER — BUPIVACAINE HCL (PF) 0.25 % IJ SOLN
INTRAMUSCULAR | Status: AC
  Filled 2017-09-25: qty 30

## 2017-09-25 MED ORDER — EPINEPHRINE PF 1 MG/ML IJ SOLN
INTRAMUSCULAR | Status: DC | PRN
  Administered 2017-09-25: 1 mg via SUBCUTANEOUS

## 2017-09-25 MED ORDER — EPHEDRINE 5 MG/ML INJ
INTRAVENOUS | Status: AC
  Filled 2017-09-25: qty 10

## 2017-09-25 MED ORDER — FENTANYL CITRATE (PF) 100 MCG/2ML IJ SOLN
INTRAMUSCULAR | Status: DC | PRN
  Administered 2017-09-25: 100 ug via INTRAVENOUS
  Administered 2017-09-25: 50 ug via INTRAVENOUS

## 2017-09-25 MED ORDER — 0.9 % SODIUM CHLORIDE (POUR BTL) OPTIME
TOPICAL | Status: DC | PRN
  Administered 2017-09-25: 1000 mL

## 2017-09-25 MED ORDER — ROCURONIUM BROMIDE 100 MG/10ML IV SOLN
INTRAVENOUS | Status: DC | PRN
  Administered 2017-09-25: 50 mg via INTRAVENOUS
  Administered 2017-09-25: 20 mg via INTRAVENOUS

## 2017-09-25 MED ORDER — DEXAMETHASONE SODIUM PHOSPHATE 10 MG/ML IJ SOLN
INTRAMUSCULAR | Status: DC | PRN
  Administered 2017-09-25: 5 mg via INTRAVENOUS

## 2017-09-25 MED ORDER — ONDANSETRON HCL 4 MG/2ML IJ SOLN
INTRAMUSCULAR | Status: AC
  Filled 2017-09-25: qty 2

## 2017-09-25 MED ORDER — SODIUM CHLORIDE 0.9 % IR SOLN
Status: DC | PRN
Start: 2017-09-25 — End: 2017-09-25
  Administered 2017-09-25: 3000 mL

## 2017-09-25 MED ORDER — LACTATED RINGERS IV SOLN
INTRAVENOUS | Status: DC | PRN
  Administered 2017-09-25 (×2): via INTRAVENOUS

## 2017-09-25 SURGICAL SUPPLY — 76 items
ALCOHOL 70% 16 OZ (MISCELLANEOUS) ×3 IMPLANT
ANCHOR SUT BIOCOMP CORKSREW (Anchor) ×6 IMPLANT
ANCHOR SUT SWIVELLOK BIO (Anchor) ×3 IMPLANT
BLADE CUTTER GATOR 3.5 (BLADE) ×3 IMPLANT
BLADE GREAT WHITE 4.2 (BLADE) ×2 IMPLANT
BLADE GREAT WHITE 4.2MM (BLADE) ×1
BLADE SURG 11 STRL SS (BLADE) ×3 IMPLANT
BUR OVAL 6.0 (BURR) ×3 IMPLANT
CLOSURE WOUND 1/2 X4 (GAUZE/BANDAGES/DRESSINGS) ×1
COVER SURGICAL LIGHT HANDLE (MISCELLANEOUS) ×3 IMPLANT
DECANTER SPIKE VIAL GLASS SM (MISCELLANEOUS) ×3 IMPLANT
DRAPE INCISE IOBAN 66X45 STRL (DRAPES) ×6 IMPLANT
DRAPE STERI 35X30 U-POUCH (DRAPES) ×3 IMPLANT
DRAPE U-SHAPE 47X51 STRL (DRAPES) ×6 IMPLANT
DRSG TEGADERM 4X4.75 (GAUZE/BANDAGES/DRESSINGS) ×9 IMPLANT
DURAPREP 26ML APPLICATOR (WOUND CARE) ×3 IMPLANT
ELECT REM PT RETURN 9FT ADLT (ELECTROSURGICAL) ×3
ELECTRODE REM PT RTRN 9FT ADLT (ELECTROSURGICAL) ×1 IMPLANT
FILTER STRAW FLUID ASPIR (MISCELLANEOUS) ×3 IMPLANT
GAUZE SPONGE 4X4 12PLY STRL (GAUZE/BANDAGES/DRESSINGS) ×3 IMPLANT
GAUZE XEROFORM 1X8 LF (GAUZE/BANDAGES/DRESSINGS) ×3 IMPLANT
GLOVE BIOGEL PI IND STRL 7.5 (GLOVE) ×1 IMPLANT
GLOVE BIOGEL PI IND STRL 8 (GLOVE) ×1 IMPLANT
GLOVE BIOGEL PI INDICATOR 7.5 (GLOVE) ×2
GLOVE BIOGEL PI INDICATOR 8 (GLOVE) ×2
GLOVE ECLIPSE 7.0 STRL STRAW (GLOVE) ×3 IMPLANT
GLOVE SURG ORTHO 8.0 STRL STRW (GLOVE) ×3 IMPLANT
GOWN STRL REUS W/ TWL LRG LVL3 (GOWN DISPOSABLE) ×3 IMPLANT
GOWN STRL REUS W/TWL LRG LVL3 (GOWN DISPOSABLE) ×6
KIT BASIN OR (CUSTOM PROCEDURE TRAY) ×3 IMPLANT
KIT ROOM TURNOVER OR (KITS) ×3 IMPLANT
MANIFOLD NEPTUNE II (INSTRUMENTS) ×3 IMPLANT
NDL SUT 6 .5 CRC .975X.05 MAYO (NEEDLE) ×1 IMPLANT
NEEDLE 18GX1X1/2 (RX/OR ONLY) (NEEDLE) ×3 IMPLANT
NEEDLE FILTER BLUNT 18X 1/2SAF (NEEDLE) ×6
NEEDLE FILTER BLUNT 18X1 1/2 (NEEDLE) ×3 IMPLANT
NEEDLE HYPO 25X1 1.5 SAFETY (NEEDLE) ×3 IMPLANT
NEEDLE MAYO TAPER (NEEDLE) ×2
NEEDLE SCORPION MULTI FIRE (NEEDLE) IMPLANT
NEEDLE SPNL 18GX3.5 QUINCKE PK (NEEDLE) ×3 IMPLANT
NS IRRIG 1000ML POUR BTL (IV SOLUTION) ×3 IMPLANT
PACK SHOULDER (CUSTOM PROCEDURE TRAY) ×3 IMPLANT
PAD ARMBOARD 7.5X6 YLW CONV (MISCELLANEOUS) ×6 IMPLANT
PUSHLOCK PEEK 4.5X24 (Orthopedic Implant) ×6 IMPLANT
RESTRAINT HEAD UNIVERSAL NS (MISCELLANEOUS) ×3 IMPLANT
SET ARTHROSCOPY TUBING (MISCELLANEOUS) ×2
SET ARTHROSCOPY TUBING LN (MISCELLANEOUS) ×1 IMPLANT
SLING ARM IMMOBILIZER LRG (SOFTGOODS) ×3 IMPLANT
SOL PREP POV-IOD 4OZ 10% (MISCELLANEOUS) ×3 IMPLANT
SPONGE LAP 4X18 X RAY DECT (DISPOSABLE) ×3 IMPLANT
STRIP CLOSURE SKIN 1/2X4 (GAUZE/BANDAGES/DRESSINGS) ×2 IMPLANT
SUCTION FRAZIER HANDLE 10FR (MISCELLANEOUS) ×2
SUCTION TUBE FRAZIER 10FR DISP (MISCELLANEOUS) ×1 IMPLANT
SUT 2 FIBERLOOP 20 STRT BLUE (SUTURE)
SUT ETHILON 3 0 PS 1 (SUTURE) ×6 IMPLANT
SUT FIBERWIRE #2 38 T-5 BLUE (SUTURE)
SUT FIBERWIRE 2-0 18 17.9 3/8 (SUTURE) ×15
SUT MNCRL AB 3-0 PS2 18 (SUTURE) ×3 IMPLANT
SUT VIC AB 0 CT1 27 (SUTURE) ×4
SUT VIC AB 0 CT1 27XBRD ANBCTR (SUTURE) ×2 IMPLANT
SUT VIC AB 1 CT1 27 (SUTURE) ×2
SUT VIC AB 1 CT1 27XBRD ANBCTR (SUTURE) ×1 IMPLANT
SUT VIC AB 2-0 CT1 27 (SUTURE) ×2
SUT VIC AB 2-0 CT1 TAPERPNT 27 (SUTURE) ×1 IMPLANT
SUT VICRYL 0 UR6 27IN ABS (SUTURE) ×3 IMPLANT
SUTURE 2 FIBERLOOP 20 STRT BLU (SUTURE) IMPLANT
SUTURE FIBERWR #2 38 T-5 BLUE (SUTURE) IMPLANT
SUTURE FIBERWR 2-0 18 17.9 3/8 (SUTURE) ×5 IMPLANT
SYR 20CC LL (SYRINGE) ×6 IMPLANT
SYR 30ML LL (SYRINGE) ×3 IMPLANT
SYR TB 1ML LUER SLIP (SYRINGE) ×6 IMPLANT
TOWEL OR 17X24 6PK STRL BLUE (TOWEL DISPOSABLE) IMPLANT
TOWEL OR 17X26 10 PK STRL BLUE (TOWEL DISPOSABLE) ×3 IMPLANT
WAND HAND CNTRL MULTIVAC 90 (MISCELLANEOUS) IMPLANT
WAND STAR VAC 90 (SURGICAL WAND) ×3 IMPLANT
WATER STERILE IRR 1000ML POUR (IV SOLUTION) ×3 IMPLANT

## 2017-09-25 NOTE — Anesthesia Procedure Notes (Signed)
Anesthesia Regional Block: Interscalene brachial plexus block   Pre-Anesthetic Checklist: ,, timeout performed, Correct Patient, Correct Site, Correct Laterality, Correct Procedure, Correct Position, site marked, Risks and benefits discussed,  Surgical consent,  Pre-op evaluation,  At surgeon's request and post-op pain management  Laterality: Right  Prep: chloraprep       Needles:  Injection technique: Single-shot  Needle Type: Stimulator Needle - 40     Needle Length: 4cm  Needle Gauge: 21     Additional Needles:   Procedures:, nerve stimulator,,,,,,,   Nerve Stimulator or Paresthesia:  Response: forearm twitch, 0.4 mA, 0.1 ms,   Additional Responses:   Narrative:  Start time: 09/25/2017 7:08 AM End time: 09/25/2017 7:14 AM Injection made incrementally with aspirations every 5 mL.  Performed by: Personally  Anesthesiologist: Jairo BenJackson, Trista Ciocca, MD  Additional Notes: Pt identified in Holding room.  Monitors applied. Working IV access confirmed. Sterile prep R clavicle and neck.  #22ga PNS to forearm twitch at 0.4840mA threshold (no US available).  30cc 0.5% Bupivacaine with 1:200k epi injected incrementally after negative test dose.  Patient asymptomatic, VSS, no heme aspirated, tolerated well.  Clinton Craze Ajaya Crutchfield, MD

## 2017-09-25 NOTE — Anesthesia Postprocedure Evaluation (Signed)
Anesthesia Post Note  Patient: Clinton CommanderJohn Weaver  Procedure(s) Performed: RIGHT SHOULDER ARTHROSCOPY WITH MINI-OPEN ROTATOR CUFF REPAIR AND SUBACROMIAL DECOMPRESSION (Right Shoulder)     Patient location during evaluation: PACU Anesthesia Type: Regional and General Level of consciousness: awake and alert Pain management: pain level controlled Vital Signs Assessment: post-procedure vital signs reviewed and stable Respiratory status: spontaneous breathing, nonlabored ventilation and respiratory function stable Cardiovascular status: blood pressure returned to baseline and stable Postop Assessment: no apparent nausea or vomiting Anesthetic complications: no    Last Vitals:  Vitals:   09/25/17 1330 09/25/17 1334  BP:    Pulse: 74 80  Resp: 16 19  Temp:    SpO2: 91% 92%    Last Pain:  Vitals:   09/25/17 1250  TempSrc:   PainSc: 3                  Arletha Marschke,E. Ulyses Panico

## 2017-09-25 NOTE — Progress Notes (Signed)
Pt. Reports that he had a stress test approx. 5 yrs. Ago, reports it was on a treadmill & told that it was wnl.  Pt. Unsure where it was done.

## 2017-09-25 NOTE — Anesthesia Procedure Notes (Signed)
Procedure Name: Intubation Date/Time: 09/25/2017 7:47 AM Performed by: Carney Living, CRNA Pre-anesthesia Checklist: Patient identified, Emergency Drugs available, Suction available, Patient being monitored and Timeout performed Patient Re-evaluated:Patient Re-evaluated prior to induction Oxygen Delivery Method: Circle system utilized Preoxygenation: Pre-oxygenation with 100% oxygen Induction Type: IV induction Ventilation: Mask ventilation without difficulty and Oral airway inserted - appropriate to patient size Laryngoscope Size: Mac and 4 Grade View: Grade II Tube type: Oral Tube size: 7.5 mm Number of attempts: 1 Airway Equipment and Method: Stylet Placement Confirmation: ETT inserted through vocal cords under direct vision,  positive ETCO2 and breath sounds checked- equal and bilateral Secured at: 23 cm Tube secured with: Tape Dental Injury: Teeth and Oropharynx as per pre-operative assessment

## 2017-09-25 NOTE — H&P (Signed)
Nemiah CommanderJohn Schlatter is an 56 y.o. male.   Chief Complaint:right shoulder pain [id number]:Clinton Weaver is a 56-year- patient with right shoulder pain.  He has had a long duration of shoulder pain.  MRI scan shows near of the rotator cuff.  Patient has had conservative treatment of the problem which is not resulted in any improvement in his function he reports pain with activities of daily living.  He is a Therapist, occupationalplant operator. Past Medical History:  Diagnosis Date  . Diabetes mellitus without complication (HCC)    type 2   . Fatty liver   . GERD (gastroesophageal reflux disease)   . Hypertension     Past Surgical History:  Procedure Laterality Date  . COLONOSCOPY    . REFRACTIVE SURGERY Bilateral   . ROTATOR CUFF REPAIR Right   . VASECTOMY    . WISDOM TOOTH EXTRACTION      Family History  Problem Relation Age of Onset  . Suicidality Mother   . CAD Father    Social History:  reports that he has quit smoking. he has never used smokeless tobacco. He reports that he drinks about 21.6 oz of alcohol per week. He reports that he does not use drugs.  Allergies: No Known Allergies  Medications Prior to Admission  Medication Sig Dispense Refill  . aspirin EC 81 MG tablet Take 81 mg by mouth daily.    Marland Kitchen. atorvastatin (LIPITOR) 80 MG tablet Take 80 mg by mouth daily.     . canagliflozin (INVOKANA) 300 MG TABS tablet Take 300 mg by mouth daily before breakfast.    . diltiazem (CARDIZEM CD) 360 MG 24 hr capsule TAKE 1 CAPSULE DAILY    . ezetimibe (ZETIA) 10 MG tablet Take 10 mg by mouth daily.     Marland Kitchen. losartan-hydrochlorothiazide (HYZAAR) 100-25 MG tablet Take 1 tablet by mouth daily.     . ranitidine (ZANTAC) 150 MG capsule Take 150 mg by mouth daily as needed for heartburn.     . SitaGLIPtin-MetFORMIN HCl (JANUMET XR) 50-1000 MG TB24 Take 2 tablets by mouth daily.    Marland Kitchen. venlafaxine (EFFEXOR) 75 MG tablet TAKE 1 TABLET DAILY      Results for orders placed or performed during the hospital encounter of 09/25/17 (from the  past 48 hour(s))  Glucose, capillary     Status: Abnormal   Collection Time: 09/25/17  6:01 AM  Result Value Ref Range   Glucose-Capillary 167 (H) 65 - 99 mg/dL   No results found.  Review of Systems  Musculoskeletal: Positive for joint pain.  All other systems reviewed and are negative.   Blood pressure (!) 146/90, pulse 77, temperature 98 F (36.7 C), temperature source Oral, resp. rate 20, SpO2 97 %. Physical Exam   Constitutional: Patient appears well-developed HEENT:  Head: Normocephalic Eyes:EOM are normal Neck: Normal range of motion Cardiovascular: Normal rate Pulmonary/chest: Effort normal Neurologic: Patient is alert Skin: Skin is warm Psychiatric: Patient has normal mood and affect  Examination of the right shoulder demonstrates no acromioclavicular joint tenderness.  Patient has good passive range of motion.  Slight weakness to supraspinatus testing.  No restriction of passive range of motion right versus left shoulder  Assessment/Plan Impression is right shoulder rotator cuff tear.  Plan is arthroscopy with mini open rotator cuff tear repair evaluation of the biceps tendon with possible biceps tenodesis.  The acromioclavicular joint has not been tender in the clinic.  Risk and benefits are discussed.  All questions answered  Burnard BuntingG Scott Maleyah Evans, MD 09/25/2017,  7:16 AM

## 2017-09-25 NOTE — Transfer of Care (Signed)
Immediate Anesthesia Transfer of Care Note  Patient: Clinton Weaver  Procedure(s) Performed: RIGHT SHOULDER ARTHROSCOPY WITH MINI-OPEN ROTATOR CUFF REPAIR AND SUBACROMIAL DECOMPRESSION (Right Shoulder)  Patient Location: PACU  Anesthesia Type:General and GA combined with regional for post-op pain  Level of Consciousness: drowsy and patient cooperative  Airway & Oxygen Therapy: Patient Spontanous Breathing and Patient connected to nasal cannula oxygen  Post-op Assessment: Report given to RN and Post -op Vital signs reviewed and stable  Post vital signs: Reviewed and stable  Last Vitals:  Vitals:   09/25/17 0559  BP: (!) 146/90  Pulse: 77  Resp: 20  Temp: 36.7 C  SpO2: 97%    Last Pain:  Vitals:   09/25/17 0559  TempSrc: Oral         Complications: No apparent anesthesia complications

## 2017-09-25 NOTE — Brief Op Note (Signed)
09/25/2017  10:01 AM  PATIENT:  Clinton Weaver  56 y.o. male  PRE-OPERATIVE DIAGNOSIS:  right shoulder rotator cuff tear  POST-OPERATIVE DIAGNOSIS:  right shoulder rotator cuff tear  PROCEDURE:  Procedure(s): RIGHT SHOULDER ARTHROSCOPY WITH MINI-OPEN ROTATOR CUFF REPAIR AND SUBACROMIAL DECOMPRESSION  SURGEON:  Surgeon(s): August Saucerean, Corrie MckusickGregory Scott, MD  ASSISTANT: Francene FindersJ Queen rnfa  ANESTHESIA:   general  EBL: 50 ml    Total I/O In: 1000 [I.V.:1000] Out: -   BLOOD ADMINISTERED: none  DRAINS: none   LOCAL MEDICATIONS USED:  none  SPECIMEN:  No Specimen  COUNTS:  YES  TOURNIQUET:  * No tourniquets in log *  DICTATION: .Other Dictation: Dictation Number [id number]  PLAN OF CARE: Discharge to home after PACU  PATIENT DISPOSITION:  PACU - hemodynamically stable

## 2017-09-28 NOTE — Op Note (Signed)
NAME:  Clinton Weaver, Clinton Weaver                ACCOUNT NO.:  [id number]  MEDICAL RECORD NO.:  [id number]  LOCATION:                                 FACILITY:  PHYSICIAN:  Burnard BuntingG. Scott Dyke Weaver, M.D.         DATE OF BIRTH:  DATE OF PROCEDURE:  09/25/2017 DATE OF DISCHARGE:                              OPERATIVE REPORT   PREOPERATIVE DIAGNOSES:  Right shoulder rotator cuff tear and biceps tendinitis.  POSTOPERATIVE DIAGNOSES:  Right shoulder rotator cuff tear and biceps tendinitis.  PROCEDURES:  Right shoulder arthroscopy with subacromial decompression, mini open rotator cuff tear repair, and biceps tenodesis.  SURGEON:  Burnard BuntingG. Scott Clinton Weaver, M.D.  ASSISTANT:  Clinton Weaver, RNFA.  INDICATIONS:  Clinton RuizJohn is a patient with rotator cuff tear and biceps tendinitis with failure of conservative management, who presents now for operative management after explanation of risks and benefits.  PROCEDURE IN DETAIL:  The patient was brought to the operating room, where general anesthetic was induced.  Preoperative antibiotics were administered.  Time-out was called.  The patient was placed in the beach- chair position with the head in neutral position.  Right arm was prescrubbed with peroxide, alcohol, and Betadine.  Prepped with DuraPrep solution, draped in a sterile manner.  Ioban used to cover the operative field.  Examination under anesthesia demonstrated forward flexion to about 160, external rotation at 15 degrees of abduction to 40, isolated glenohumeral abduction 95.  Following examination under anesthesia, a time-out was called and after sterile prepping and draping, the posterior portal was created 2 cm medial and inferior to the posterolateral margin of the acromion.  Diagnostic arthroscopy was performed.  The patient had significant biceps tendinitis.  Biceps tendon was released and limited debridement done of the superior labrum. The rotator cuff was also inspected and found to have a 90% thickness tear,  U-shaped configuration leading edge of the supraspinatus.  This was debrided as well with a shaver.  This was all done after creating an anterior portal under direct visualization.  The superior humeral head had degenerative changes.  The articulating humeral head surface and the glenoid were intact.  Anterior-inferior, posterior-inferior glenohumeral ligaments were intact.  At this time, instruments were removed.  Portals were closed using 3-0 nylon.  Incision was made off the anterolateral margin of the acromion.  Skin and subcutaneous tissue were sharply divided.  The biceps tendon was then tenodesed into the bicipital groove after splitting the deltoid, measured distance of 4 cm, which was marked at its inferior portion with a #1 Vicryl suture.  Biceps tendon was tenodesed in the bicipital groove using a 7-0 SwiveLock suture.  This gave appropriate tension to the tenodesed biceps tendon.  At this time, attention was directed towards the rotator cuff tear.  This is primarily U-shaped configuration.  Six 2-0 FiberWire margin convergence stitch sutures were placed.  They were not tied.  Two 6.5 corkscrew anchors were placed and the 8 FiberTapes were then placed through healthy tissue just medial to the apex of the rotator cuff tear.  The FiberTapes were tied first.  Margin convergence sutures were then tied and a watertight repair was achieved.  The  suture tapes were then draped in cross- hatching fashion over the rotator cuff tear matted down to the greater tuberosity footprint.  They were then secured with 2 PushLocks.  All in all, a watertight repair was achieved.  Thorough irrigation performed.  Deltoid split closed using 0 Vicryl suture, followed by interrupted inverted 0 Vicryl suture, 2-0 Vicryl suture, and 3-0 Monocryl.  Steri-Strips and waterproof dressings were applied.  The patient tolerated the procedure well without immediate complications.  A sling was applied.     Burnard Bunting, M.D.   ______________________________ Clinton Weaver. Dorene Grebe, M.D.    GSD/MEDQ  D:  09/25/2017  T:  09/26/2017  Job:  [id number]

## 2017-09-29 ENCOUNTER — Encounter (HOSPITAL_COMMUNITY): Payer: Self-pay | Admitting: Orthopedic Surgery

## 2017-10-05 ENCOUNTER — Inpatient Hospital Stay (INDEPENDENT_AMBULATORY_CARE_PROVIDER_SITE_OTHER): Payer: 59 | Admitting: Orthopedic Surgery

## 2017-10-08 ENCOUNTER — Encounter (INDEPENDENT_AMBULATORY_CARE_PROVIDER_SITE_OTHER): Payer: Self-pay | Admitting: Orthopedic Surgery

## 2017-10-08 ENCOUNTER — Ambulatory Visit (INDEPENDENT_AMBULATORY_CARE_PROVIDER_SITE_OTHER): Payer: 59 | Admitting: Orthopedic Surgery

## 2017-10-08 DIAGNOSIS — M75101 Unspecified rotator cuff tear or rupture of right shoulder, not specified as traumatic: Secondary | ICD-10-CM

## 2017-10-08 NOTE — Progress Notes (Signed)
   Post-Op Visit Note   Patient: Clinton Weaver           Date of Birth: [birth year]           MRN: [id number] Visit Date: 10/08/2017 PCP: Jolene ProvostHaimes, David M, MD   Assessment & Plan:  Chief Complaint:  Chief Complaint  Patient presents with  . Right Shoulder - Follow-up, Routine Post Op   Visit Diagnoses:  1. Tear of right rotator cuff, unspecified tear extent     Plan: Clinton Weaver is a patient is now 2 weeks out right shoulder rotator cuff repair.  In general he is doing well.  On exam he is got good passive range of motion.  He has been in the CPM machine.  He is not taking any medication for pain.  He runs a [employer].  On exam passive range of motion is good in the deltoid fires.  No coarse grinding or crepitus.  Plan is to discontinue the sling 1 week from Monday which will be Christmas eve no lifting with right arm.  4-week return for clinical recheck.  Form filled out today that he can return to work October 30, 2017 with the restriction of no lifting with the right arm  Follow-Up Instructions: Return in about 4 weeks (around 11/05/2017).   Orders:  No orders of the defined types were placed in this encounter.  No orders of the defined types were placed in this encounter.   Imaging: No results found.  PMFS History: Patient Active Problem List   Diagnosis Date Noted  . KNEE PAIN, LEFT 12/12/2009  . OTHER ACQUIRED DEFORMITY OF ANKLE AND FOOT OTHER 12/12/2009   Past Medical History:  Diagnosis Date  . Diabetes mellitus without complication (HCC)    type 2   . Fatty liver   . GERD (gastroesophageal reflux disease)   . Hypertension     Family History  Problem Relation Age of Onset  . Suicidality Mother   . CAD Father     Past Surgical History:  Procedure Laterality Date  . COLONOSCOPY    . REFRACTIVE SURGERY Bilateral   . ROTATOR CUFF REPAIR Right   . SHOULDER ARTHROSCOPY WITH ROTATOR CUFF REPAIR AND SUBACROMIAL DECOMPRESSION Right 09/25/2017   Procedure: RIGHT  SHOULDER ARTHROSCOPY WITH MINI-OPEN ROTATOR CUFF REPAIR AND SUBACROMIAL DECOMPRESSION;  Surgeon: Cammy Copaean, Gregory Scott, MD;  [facility];  Service: Orthopedics;  Laterality: Right;  RNFA  . VASECTOMY    . WISDOM TOOTH EXTRACTION     Social History   Occupational History  . Not on file  Tobacco Use  . Smoking status: Former Games developermoker  . Smokeless tobacco: Never Used  Substance and Sexual Activity  . Alcohol use: Yes    Alcohol/week: 21.6 oz    Types: 36 Cans of beer per week    Comment: 6 pack a day (except Sunday)  . Drug use: No  . Sexual activity: Not on file

## 2017-11-03 ENCOUNTER — Encounter (HOSPITAL_COMMUNITY): Payer: Self-pay | Admitting: Orthopedic Surgery

## 2017-11-06 ENCOUNTER — Encounter (INDEPENDENT_AMBULATORY_CARE_PROVIDER_SITE_OTHER): Payer: Self-pay | Admitting: Orthopedic Surgery

## 2017-11-06 ENCOUNTER — Ambulatory Visit (INDEPENDENT_AMBULATORY_CARE_PROVIDER_SITE_OTHER): Payer: 59 | Admitting: Orthopedic Surgery

## 2017-11-06 DIAGNOSIS — M75101 Unspecified rotator cuff tear or rupture of right shoulder, not specified as traumatic: Secondary | ICD-10-CM

## 2017-11-06 NOTE — Progress Notes (Signed)
   Post-Op Visit Note   Patient: Clinton Weaver           Date of Birth: 1960-12-16           MRN: [id number] Visit Date: 11/06/2017 PCP: Jolene ProvostHaimes, David M, MD   Assessment & Plan:  Chief Complaint:  Chief Complaint  Patient presents with  . Right Shoulder - Follow-up, Routine Post Op   Visit Diagnoses:  1. Tear of right rotator cuff, unspecified tear extent     Plan: Clinton Weaver is a patient who is now 6 weeks out right shoulder rotator cuff repair.  He is doing well.  He got CPM machine up to 120.  Has not started therapy yet.  On exam he is got very good passive range of motion and very smooth feeling range of motion of the shoulder.  Strength is improving.  I want him to start physical therapy for strengthening.  6-week return for final check and release  Follow-Up Instructions: Return in about 6 weeks (around 12/18/2017).   Orders:  No orders of the defined types were placed in this encounter.  No orders of the defined types were placed in this encounter.   Imaging: No results found.  PMFS History: Patient Active Problem List   Diagnosis Date Noted  . KNEE PAIN, LEFT 12/12/2009  . OTHER ACQUIRED DEFORMITY OF ANKLE AND FOOT OTHER 12/12/2009   Past Medical History:  Diagnosis Date  . Diabetes mellitus without complication (HCC)    type 2   . Fatty liver   . GERD (gastroesophageal reflux disease)   . Hypertension     Family History  Problem Relation Age of Onset  . Suicidality Mother   . CAD Father     Past Surgical History:  Procedure Laterality Date  . COLONOSCOPY    . REFRACTIVE SURGERY Bilateral   . ROTATOR CUFF REPAIR Right   . SHOULDER ARTHROSCOPY WITH ROTATOR CUFF REPAIR AND SUBACROMIAL DECOMPRESSION Right 09/25/2017   Procedure: RIGHT SHOULDER ARTHROSCOPY WITH MINI-OPEN ROTATOR CUFF REPAIR AND SUBACROMIAL DECOMPRESSION;  Surgeon: Cammy Copaean, Rushie Brazel Scott, MD;  [facility];  Service: Orthopedics;  Laterality: Right;  RNFA  . VASECTOMY    . WISDOM TOOTH  EXTRACTION     Social History   Occupational History  . Not on file  Tobacco Use  . Smoking status: Former Games developermoker  . Smokeless tobacco: Never Used  Substance and Sexual Activity  . Alcohol use: Yes    Alcohol/week: 21.6 oz    Types: 36 Cans of beer per week    Comment: 6 pack a day (except Sunday)  . Drug use: No  . Sexual activity: Not on file

## 2017-11-11 ENCOUNTER — Telehealth (INDEPENDENT_AMBULATORY_CARE_PROVIDER_SITE_OTHER): Payer: Self-pay | Admitting: Orthopedic Surgery

## 2017-11-11 NOTE — Telephone Encounter (Signed)
Do you have a new form on this patient?

## 2017-11-11 NOTE — Telephone Encounter (Signed)
Judeth CornfieldStephanie called from [employer] [employer] [facility] [employer] asking about the patients surgery and how long the short term disability would need to be extended. CB # [id number] ex [phone]

## 2017-11-16 NOTE — Telephone Encounter (Signed)
I do not have a form for this patient. I received an authorization for [facility] [employer] [employer] from the patient a few months ago, but I never received a form.

## 2017-11-16 NOTE — Telephone Encounter (Signed)
Notes faxed to 11/13/17 per notes in chart

## 2018-03-04 ENCOUNTER — Encounter (INDEPENDENT_AMBULATORY_CARE_PROVIDER_SITE_OTHER): Payer: Self-pay | Admitting: Orthopedic Surgery

## 2018-03-04 ENCOUNTER — Ambulatory Visit (INDEPENDENT_AMBULATORY_CARE_PROVIDER_SITE_OTHER): Payer: 59 | Admitting: Orthopedic Surgery

## 2018-03-04 DIAGNOSIS — M25511 Pain in right shoulder: Secondary | ICD-10-CM

## 2018-03-07 ENCOUNTER — Encounter (INDEPENDENT_AMBULATORY_CARE_PROVIDER_SITE_OTHER): Payer: Self-pay | Admitting: Orthopedic Surgery

## 2018-03-07 NOTE — Progress Notes (Signed)
Office Visit Note   Patient: Clinton Weaver           Date of Birth: Feb 07, 1961           MRN: [id number] Visit Date: 03/04/2018 Requested by: Jolene Provost, MD [address] [place], [place] [address] PCP: Jolene Provost, MD  Subjective: Chief Complaint  Patient presents with  . Right Shoulder - Pain    HPI: Clinton Weaver is a patient with right shoulder pain.  Had arthroscopy and rotator cuff repair in November 2018.  He has had pain for about 4 or 5 days.  He wants to make sure that something has not been "messed up".  He states that the shoulder feels loose at times.              ROS: All systems reviewed are negative as they relate to the chief complaint within the history of present illness.  Patient denies  fevers or chills.   Assessment & Plan: Visit Diagnoses:  1. Right shoulder pain, unspecified chronicity     Plan: Impression is right shoulder pain but with well-functioning and well feeling rotator cuff.  Plan is observation for now.  I would probably consider injection before any type of repeat imaging unless his exam changes.  Follow-Up Instructions: Return if symptoms worsen or fail to improve.   Orders:  No orders of the defined types were placed in this encounter.  No orders of the defined types were placed in this encounter.     Procedures: No procedures performed   Clinical Data: No additional findings.  Objective: Vital Signs: There were no vitals taken for this visit.  Physical Exam:   Constitutional: Patient appears well-developed HEENT:  Head: Normocephalic Eyes:EOM are normal Neck: Normal range of motion Cardiovascular: Normal rate Pulmonary/chest: Effort normal Neurologic: Patient is alert Skin: Skin is warm Psychiatric: Patient has normal mood and affect    Ortho Exam: Orthopedic exam demonstrates pretty good active and passive range of motion of the right shoulder.  Rotator cuff strength is good to infraspinatus supraspinatus and  subscap muscle testing.  I do not detect any coarse grinding or crepitus with active or passive range of motion of the arm at 90 degrees of abduction with internal and external rotation.  Specialty Comments:  No specialty comments available.  Imaging: No results found.   PMFS History: Patient Active Problem List   Diagnosis Date Noted  . KNEE PAIN, LEFT 12/12/2009  . OTHER ACQUIRED DEFORMITY OF ANKLE AND FOOT OTHER 12/12/2009   Past Medical History:  Diagnosis Date  . Diabetes mellitus without complication (HCC)    type 2   . Fatty liver   . GERD (gastroesophageal reflux disease)   . Hypertension     Family History  Problem Relation Age of Onset  . Suicidality Mother   . CAD Father     Past Surgical History:  Procedure Laterality Date  . COLONOSCOPY    . REFRACTIVE SURGERY Bilateral   . ROTATOR CUFF REPAIR Right   . SHOULDER ARTHROSCOPY WITH ROTATOR CUFF REPAIR AND SUBACROMIAL DECOMPRESSION Right 09/25/2017   Procedure: RIGHT SHOULDER ARTHROSCOPY WITH MINI-OPEN ROTATOR CUFF REPAIR AND SUBACROMIAL DECOMPRESSION;  Surgeon: Cammy Copa, MD;  Location: [facility] OR;  Service: Orthopedics;  Laterality: Right;  RNFA  . VASECTOMY    . WISDOM TOOTH EXTRACTION     Social History   Occupational History  . Not on file  Tobacco Use  . Smoking status: Former Games developer  .  Smokeless tobacco: Never Used  Substance and Sexual Activity  . Alcohol use: Yes    Alcohol/week: 21.6 oz    Types: 36 Cans of beer per week    Comment: 6 pack a day (except Sunday)  . Drug use: No  . Sexual activity: Not on file

## 2018-10-17 IMAGING — XA DG FLUORO GUIDE NDL PLC/BX
2 series · 2 of 2 positions shown · IV contrast (multihance)
Comparison: none

CLINICAL DATA: Right shoulder pain.

EXAM:
RIGHT SHOULDER INJECTION UNDER FLUOROSCOPY
TECHNIQUE: An appropriate skin entrance site was determined. The site was
marked, prepped with Betadine, draped in the usual sterile fashion,
and infiltrated locally with buffered Lidocaine. 22 gauge spinal
needle was advanced to the superomedial margin of the humeral head
under intermittent fluoroscopy. 1 ml of Lidocaine injected easily. A
mixture of 0.05 mL of MultiHance, 5 mL of 1% lidocaine, and 15 mL of
Isovue-M 200 was then used to opacify the right shoulder capsule. 12
mL of this mixture were injected. No immediate complication.
FLUOROSCOPY TIME:  Fluoroscopy Time:  3 seconds
Radiation Exposure Index (if provided by the fluoroscopic device):
15.24 microGray*m^2
Number of Acquired Spot Images: 0

[Series 1: ortho standard · 1 of 1 slices shown (1 of 2)]
[im 1/1]
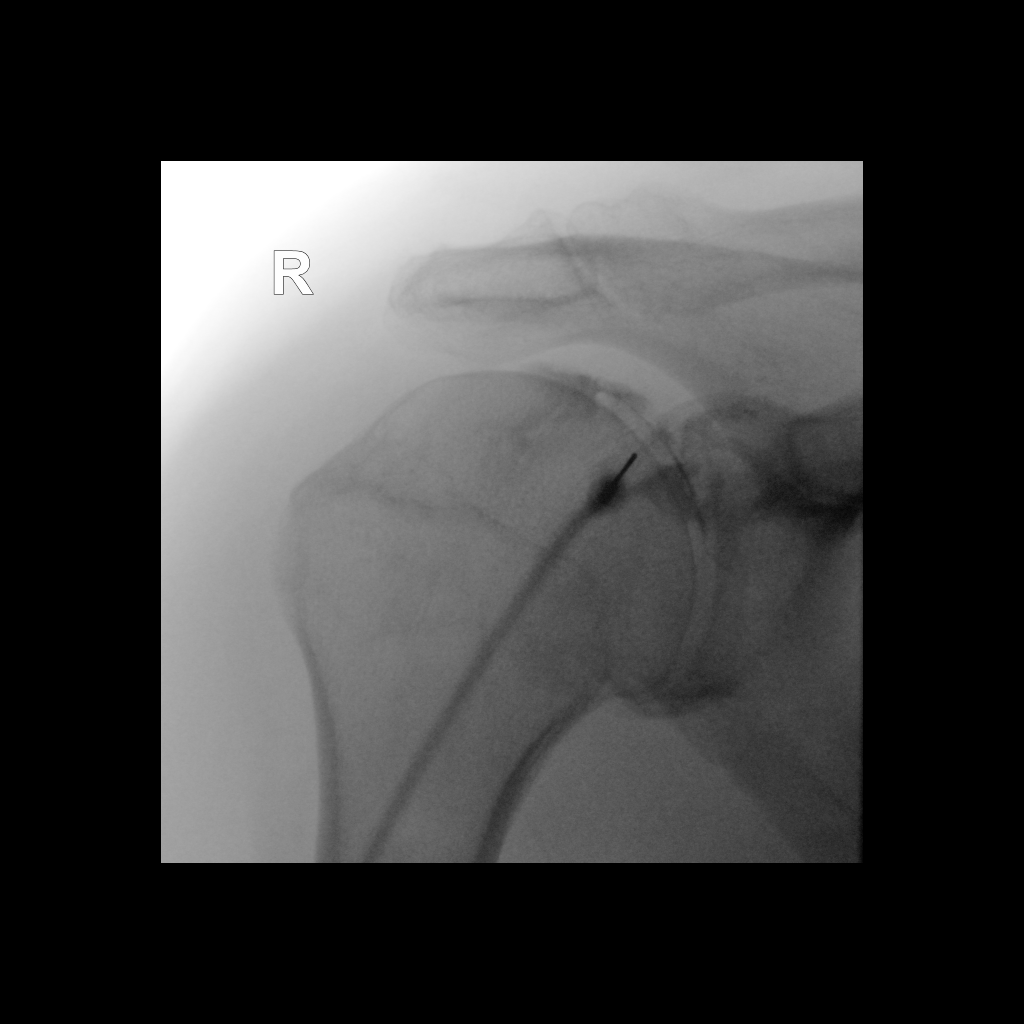

[Series 2: ortho standard · 1 of 1 slices shown (2 of 2)]
[im 1/1]
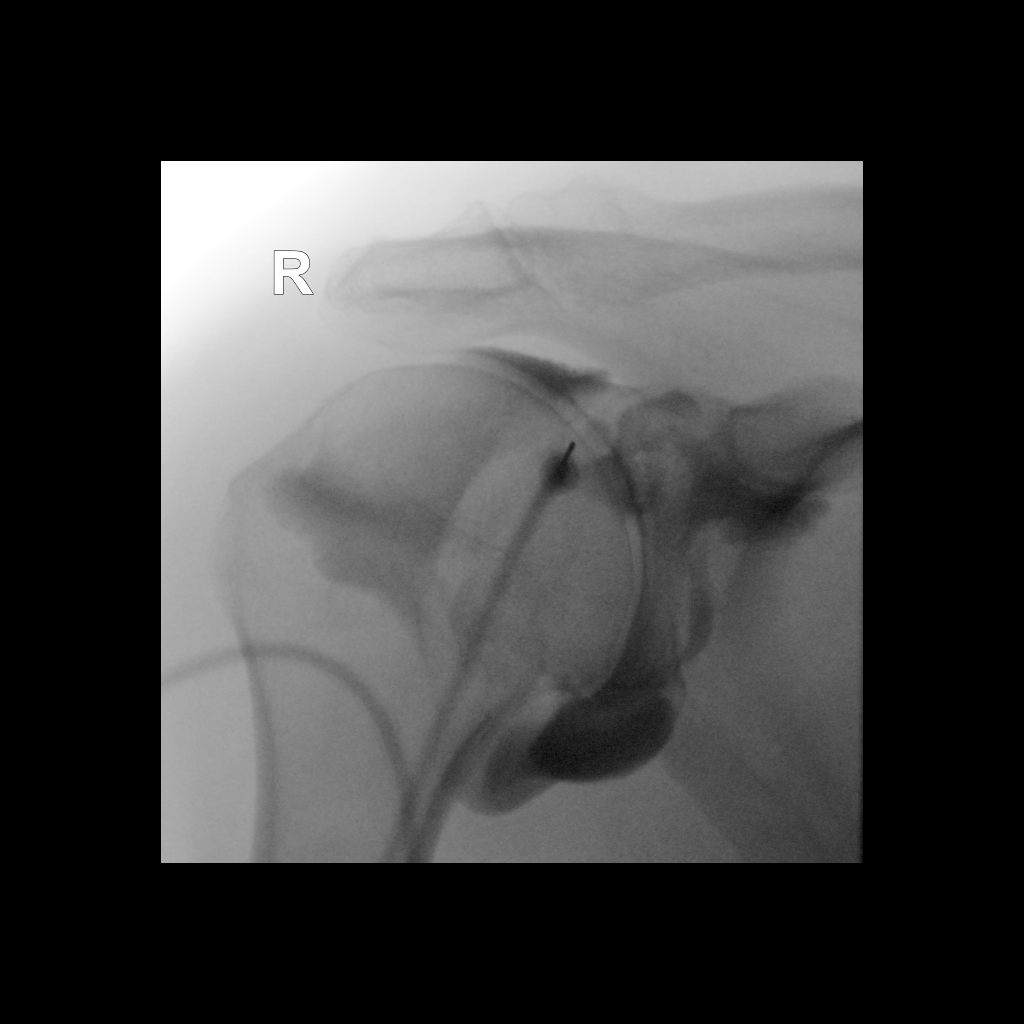

[2 of 2 positions shown; findings below may reference images not displayed]

IMPRESSION: Technically successful right shoulder injection for MRI.
